# Patient Record
Sex: Male | Born: 1985 | Race: Black or African American | Hispanic: No | Marital: Married | Smoking: Current every day smoker
Health system: Southern US, Community
[De-identification: ages and names within clinical notes are randomized; demographics above are authoritative.]

## PROBLEM LIST (undated history)

## (undated) HISTORY — PX: SKIN GRAFT: SHX250

---
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
## 2007-04-15 ENCOUNTER — Emergency Department: Payer: Self-pay | Admitting: Unknown Physician Specialty

## 2007-10-02 ENCOUNTER — Emergency Department: Payer: Self-pay | Admitting: Emergency Medicine

## 2009-03-13 ENCOUNTER — Emergency Department: Payer: Self-pay | Admitting: Emergency Medicine

## 2009-03-14 ENCOUNTER — Emergency Department: Payer: Self-pay | Admitting: Emergency Medicine

## 2010-07-03 ENCOUNTER — Emergency Department: Payer: Self-pay | Admitting: Emergency Medicine

## 2011-01-17 ENCOUNTER — Ambulatory Visit: Payer: Self-pay | Admitting: Family Medicine

## 2011-01-23 ENCOUNTER — Emergency Department: Payer: Self-pay | Admitting: Emergency Medicine

## 2012-04-24 ENCOUNTER — Emergency Department: Payer: Self-pay | Admitting: Emergency Medicine

## 2014-02-28 ENCOUNTER — Emergency Department: Payer: Self-pay | Admitting: Emergency Medicine

## 2014-08-28 ENCOUNTER — Emergency Department: Payer: Self-pay | Admitting: Emergency Medicine

## 2014-08-28 LAB — URINALYSIS, COMPLETE
BACTERIA: NONE SEEN
BLOOD: NEGATIVE
Bilirubin,UR: NEGATIVE
GLUCOSE, UR: NEGATIVE mg/dL (ref 0–75)
Leukocyte Esterase: NEGATIVE
Nitrite: NEGATIVE
Ph: 5 (ref 4.5–8.0)
Protein: NEGATIVE
Specific Gravity: 1.03 (ref 1.003–1.030)
WBC UR: NONE SEEN /HPF (ref 0–5)

## 2014-11-27 ENCOUNTER — Emergency Department: Payer: Self-pay | Admitting: Emergency Medicine

## 2014-12-08 ENCOUNTER — Emergency Department: Payer: Self-pay | Admitting: Emergency Medicine

## 2015-02-19 ENCOUNTER — Emergency Department: Admit: 2015-02-19 | Disposition: A | Discharge status: Home / Self Care | Payer: Self-pay | Admitting: Internal Medicine

## 2015-06-08 ENCOUNTER — Encounter (HOSPITAL_COMMUNITY): Payer: Self-pay | Admitting: *Deleted

## 2015-06-08 ENCOUNTER — Emergency Department (HOSPITAL_COMMUNITY)
Admission: EM | Admit: 2015-06-08 | Discharge: 2015-06-08 | Disposition: A | Discharge status: Home / Self Care | Payer: Medicaid Other | Attending: Emergency Medicine | Admitting: Emergency Medicine

## 2015-06-08 DIAGNOSIS — Y929 Unspecified place or not applicable: Secondary | ICD-10-CM | POA: Insufficient documentation

## 2015-06-08 DIAGNOSIS — S39012A Strain of muscle, fascia and tendon of lower back, initial encounter: Secondary | ICD-10-CM | POA: Insufficient documentation

## 2015-06-08 DIAGNOSIS — Z720 Tobacco use: Secondary | ICD-10-CM | POA: Diagnosis not present

## 2015-06-08 DIAGNOSIS — X58XXXA Exposure to other specified factors, initial encounter: Secondary | ICD-10-CM | POA: Insufficient documentation

## 2015-06-08 DIAGNOSIS — Y990 Civilian activity done for income or pay: Secondary | ICD-10-CM | POA: Insufficient documentation

## 2015-06-08 DIAGNOSIS — Z880 Allergy status to penicillin: Secondary | ICD-10-CM | POA: Diagnosis not present

## 2015-06-08 DIAGNOSIS — S3992XA Unspecified injury of lower back, initial encounter: Secondary | ICD-10-CM | POA: Diagnosis present

## 2015-06-08 DIAGNOSIS — Y939 Activity, unspecified: Secondary | ICD-10-CM | POA: Insufficient documentation

## 2015-06-08 DIAGNOSIS — T148XXA Other injury of unspecified body region, initial encounter: Secondary | ICD-10-CM

## 2015-06-08 LAB — CK: CK TOTAL: 286 U/L (ref 49–397)

## 2015-06-08 MED ORDER — NAPROXEN 500 MG PO TABS: 500.0000 mg | ORAL_TABLET | Freq: Two times a day (BID) | ORAL | Status: DC

## 2015-06-08 MED ORDER — METHOCARBAMOL 500 MG PO TABS: 500.0000 mg | ORAL_TABLET | Freq: Two times a day (BID) | ORAL | Status: DC | PRN

## 2015-06-08 MED ORDER — METHOCARBAMOL 500 MG PO TABS
500.0000 mg | ORAL_TABLET | Freq: Once | ORAL | Status: AC
  Administered 2015-06-08: 500 mg via ORAL
  Filled 2015-06-08: qty 1

## 2015-06-08 MED ORDER — KETOROLAC TROMETHAMINE 60 MG/2ML IM SOLN
60.0000 mg | Freq: Once | INTRAMUSCULAR | Status: AC
  Administered 2015-06-08: 60 mg via INTRAMUSCULAR
  Filled 2015-06-08: qty 2

## 2015-06-08 NOTE — ED Notes (Signed)
Pt c/o lower back pain starting this morning. 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 Has not tried anything over the counter.

## 2015-06-08 NOTE — ED Notes (Signed)
Contacted lab for delay on CK; test still running

## 2015-06-08 NOTE — Discharge Instructions (Signed)

## 2015-06-08 NOTE — ED Provider Notes (Signed)
CSN: [id number]     Arrival date & time 06/08/15  1952 History  This chart was scribed for Eber Hong, MD by Budd Palmer, ED Scribe. This patient was seen in room TR08C/TR08C and the patient's care was started at 8:33 PM.    Chief Complaint  Patient presents with  . Back Pain   The history is provided by the patient. No language interpreter was used.   HPI Comments: Jeffery Caldwell is a 29 y.o. male who presents to the Emergency Department complaining of constant, aching middle and lower back pain onset when he woke up this morning. He states he had difficulty getting up and notes exacerbation of the pain with movement. He notes alleviation by sitting still. 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 Pt has had a childhood back injury, but no problems since. He denies a PMHx of back pain. Pt denies numbness, weakness, difficulty urinating, fever, and chills.  History reviewed. No pertinent past medical history. Past Surgical History  Procedure Laterality Date  . Skin graft     No family history on file. History  Substance Use Topics  . Smoking status: Current Every Day Smoker -- 1.00 packs/day    Types: Cigarettes  . Smokeless tobacco: Not on file  . Alcohol Use: No    Review of Systems  Constitutional: Negative for fever and chills.  Genitourinary: Negative for difficulty urinating.  Musculoskeletal: Positive for back pain.  Neurological: Negative for weakness and numbness.    Allergies  Penicillins  Home Medications   Prior to Admission medications   Medication Sig Start Date End Date Taking? Authorizing Provider  methocarbamol (ROBAXIN) 500 MG tablet Take 1 tablet (500 mg total) by mouth 2 (two) times daily as needed for muscle spasms. 06/08/15   Eber Hong, MD  naproxen (NAPROSYN) 500 MG tablet Take 1 tablet (500 mg total) by mouth 2 (two) times daily with a meal. 06/08/15   Eber Hong, MD   BP 135/75 mmHg  Pulse 76  Temp(Src) 98 F (36.7 C) (Oral)   Resp 18  Ht 5\' 11"  (1.803 m)  Wt 240 lb (108.863 kg)  BMI 33.49 kg/m2  SpO2 98% Physical Exam  Constitutional: He appears well-developed and well-nourished.  HENT:  Head: Normocephalic and atraumatic.  Eyes: Conjunctivae are normal. Right eye exhibits no discharge. Left eye exhibits no discharge.  Pulmonary/Chest: Effort normal. No respiratory distress.  Musculoskeletal: He exhibits tenderness.  TTP over the back muscles from his upper lumbar to mid-thoracic spine. No bony tenderness.  Neurological: He is alert. Coordination normal.  Steady gait. Normal strength at hips an knees bil.  Skin: Skin is warm and dry. No rash noted. He is not diaphoretic. No erythema.  Psychiatric: He has a normal mood and affect.  Nursing note and vitals reviewed.   ED Course  Procedures  DIAGNOSTIC STUDIES: Oxygen Saturation is 98% on RA, normal by my interpretation.    COORDINATION OF CARE: 8:38 PM - Discussed possible muscle strain and breakdown. Discussed plans to order diagnostic studies, muscle relaxant, and pain medication. Pt advised of plan for treatment and pt agrees.  Labs Review Labs Reviewed  CK    Imaging Review No results found.  The pt has refused to stay to get his CK resulted - his VS are normal - he is better after meds.  Recommeded that he f/u with his PCP for recheck and review labs if no better - encouraged hydration for next several days.  MDM  Final diagnoses:  Muscle strain   Meds given in ED:  Medications  methocarbamol (ROBAXIN) tablet 500 mg (500 mg Oral Given 06/08/15 2046)  ketorolac (TORADOL) injection 60 mg (60 mg Intramuscular Given 06/08/15 2045)    New Prescriptions   METHOCARBAMOL (ROBAXIN) 500 MG TABLET    Take 1 tablet (500 mg total) by mouth 2 (two) times daily as needed for muscle spasms.   NAPROXEN (NAPROSYN) 500 MG TABLET    Take 1 tablet (500 mg total) by mouth 2 (two) times daily with a meal.      I personally performed the services  described in this documentation, which was scribed in my presence. The recorded information has been reviewed and considered.     Eber Hong, MD 06/08/15 2211

## 2015-11-26 ENCOUNTER — Emergency Department
Admission: EM | Admit: 2015-11-26 | Discharge: 2015-11-27 | Disposition: A | Discharge status: Home / Self Care | Payer: Medicaid Other | Attending: Student | Admitting: Student

## 2015-11-26 ENCOUNTER — Emergency Department: Payer: Medicaid Other

## 2015-11-26 DIAGNOSIS — Y9289 Other specified places as the place of occurrence of the external cause: Secondary | ICD-10-CM | POA: Diagnosis not present

## 2015-11-26 DIAGNOSIS — F17210 Nicotine dependence, cigarettes, uncomplicated: Secondary | ICD-10-CM | POA: Diagnosis not present

## 2015-11-26 DIAGNOSIS — Z880 Allergy status to penicillin: Secondary | ICD-10-CM | POA: Diagnosis not present

## 2015-11-26 DIAGNOSIS — W2209XA Striking against other stationary object, initial encounter: Secondary | ICD-10-CM | POA: Insufficient documentation

## 2015-11-26 DIAGNOSIS — S60221A Contusion of right hand, initial encounter: Secondary | ICD-10-CM | POA: Diagnosis not present

## 2015-11-26 DIAGNOSIS — Z792 Long term (current) use of antibiotics: Secondary | ICD-10-CM | POA: Diagnosis not present

## 2015-11-26 DIAGNOSIS — Y998 Other external cause status: Secondary | ICD-10-CM | POA: Diagnosis not present

## 2015-11-26 DIAGNOSIS — Y9389 Activity, other specified: Secondary | ICD-10-CM | POA: Diagnosis not present

## 2015-11-26 DIAGNOSIS — S6991XA Unspecified injury of right wrist, hand and finger(s), initial encounter: Secondary | ICD-10-CM | POA: Diagnosis present

## 2015-11-26 MED ORDER — IBUPROFEN 800 MG PO TABS: 800.0000 mg | ORAL_TABLET | Freq: Three times a day (TID) | ORAL | Status: DC | PRN

## 2015-11-26 MED ORDER — OXYCODONE-ACETAMINOPHEN 5-325 MG PO TABS
1.0000 | ORAL_TABLET | Freq: Once | ORAL | Status: AC
  Administered 2015-11-26: 1 via ORAL
  Filled 2015-11-26: qty 1

## 2015-11-26 MED ORDER — TRAMADOL HCL 50 MG PO TABS: 50.0000 mg | ORAL_TABLET | Freq: Four times a day (QID) | ORAL | Status: DC | PRN

## 2015-11-26 NOTE — Discharge Instructions (Signed)
Wear splint as needed Hand Contusion  A hand contusion is a deep bruise to the hand. Contusions happen when an injury causes bleeding under the skin. Signs of bruising include pain, puffiness (swelling), and discolored skin. The contusion may turn blue, purple, or yellow. HOME CARE  Put ice on the injured area.  Put ice in a plastic bag.  Place a towel between your skin and the bag.  Leave the ice on for 15-20 minutes, 03-04 times a day.  Only take medicines as told by your doctor.  Use an elastic wrap only as told. You may remove the wrap for sleeping, showering, and bathing. Take the wrap off if you lose feeling (have numbness) in your fingers, or they turn blue or cold. Put the wrap on more loosely.  Keep the hand raised (elevated) with pillows.  Avoid using your hand too much if it is painful. GET HELP RIGHT AWAY IF:   You have more redness, puffiness, or pain in your hand.  Your puffiness or pain does not get better with medicine.  You lose feeling in your hand, or you cannot move your fingers.  Your hand turns cold or blue.  You have pain when you move your fingers.  Your hand feels warm.  Your contusion does not get better in 2 days. MAKE SURE YOU:   Understand these instructions.  Will watch this condition.  Will get help right away if you are not doing well or you get worse.   This information is not intended to replace advice given to you by your health care provider. Make sure you discuss any questions you have with your health care provider.   Document Released: 04/11/2008 Document Revised: 11/14/2014 Document Reviewed: 04/16/2012 Elsevier Interactive Patient Education [employer]
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
## 2016-04-27 ENCOUNTER — Emergency Department
Admission: EM | Admit: 2016-04-27 | Discharge: 2016-04-27 | Disposition: A | Discharge status: Home / Self Care | Payer: Medicaid Other | Attending: Student | Admitting: Student

## 2016-04-27 ENCOUNTER — Encounter: Payer: Self-pay | Admitting: Emergency Medicine

## 2016-04-27 DIAGNOSIS — F17210 Nicotine dependence, cigarettes, uncomplicated: Secondary | ICD-10-CM | POA: Insufficient documentation

## 2016-04-27 DIAGNOSIS — T63464A Toxic effect of venom of wasps, undetermined, initial encounter: Secondary | ICD-10-CM | POA: Insufficient documentation

## 2016-04-27 MED ORDER — IBUPROFEN 600 MG PO TABS: 600.0000 mg | ORAL_TABLET | Freq: Four times a day (QID) | ORAL | Status: DC | PRN

## 2016-04-27 MED ORDER — IBUPROFEN 600 MG PO TABS
600.0000 mg | ORAL_TABLET | Freq: Once | ORAL | Status: AC
  Administered 2016-04-27: 600 mg via ORAL
  Filled 2016-04-27: qty 1

## 2016-04-27 MED ORDER — EPINEPHRINE 0.3 MG/0.3ML IJ SOAJ: 0.3000 mg | Freq: Once | INTRAMUSCULAR | Status: DC

## 2016-04-27 MED ORDER — DIPHENHYDRAMINE HCL 25 MG PO CAPS
25.0000 mg | ORAL_CAPSULE | Freq: Once | ORAL | Status: AC
  Administered 2016-04-27: 25 mg via ORAL
  Filled 2016-04-27: qty 1

## 2016-04-27 MED ORDER — DIPHENHYDRAMINE HCL 25 MG PO CAPS: 25.0000 mg | ORAL_CAPSULE | Freq: Every evening | ORAL | Status: AC | PRN

## 2016-04-27 NOTE — ED Notes (Addendum)
Patient ambulatory to triage with steady gait, without difficulty or distress noted; pt reports yellow jacket sting to right ankle at 11pm; c/o pain/itching to site; denies any other accomp symptoms at present

## 2016-04-27 NOTE — ED Provider Notes (Signed)
[facility] Emergency Department Provider Note   ____________________________________________  Time seen: Approximately 2:34 AM  I have reviewed the triage vital signs and the nursing notes.   HISTORY  Chief Complaint No chief complaint on file.    HPI Jeffery Caldwell is a 30 y.o. male with history of local reactions to yellow jacket stings, no history of anaphylaxis who presents for evaluation of swelling, itching, and pain to the medial aspect of the right ankle secondary to a yellow jacket sting which occurred last night at 11 PM, gradual onset, constant, moderate pain, worse when he moves his ankle. No shortness of breath, no lip or tongue swelling, no vomiting or rash. No chest pain or difficulty breathing.   History reviewed. No pertinent past medical history.  There are no active problems to display for this patient.   Past Surgical History  Procedure Laterality Date  . Skin graft      Current Outpatient Rx  Name  Route  Sig  Dispense  Refill  . ibuprofen (ADVIL,MOTRIN) 800 MG tablet   Oral   Take 1 tablet (800 mg total) by mouth every 8 (eight) hours as needed.   30 tablet   0   . methocarbamol (ROBAXIN) 500 MG tablet   Oral   Take 1 tablet (500 mg total) by mouth 2 (two) times daily as needed for muscle spasms.   20 tablet   0   . naproxen (NAPROSYN) 500 MG tablet   Oral   Take 1 tablet (500 mg total) by mouth 2 (two) times daily with a meal.   30 tablet   0   . traMADol (ULTRAM) 50 MG tablet   Oral   Take 1 tablet (50 mg total) by mouth every 6 (six) hours as needed for moderate pain.   12 tablet   0     Allergies Penicillins  No family history on file.  Social History Social History  Substance Use Topics  . Smoking status: Current Every Day Smoker -- 1.00 packs/day    Types: Cigarettes  . Smokeless tobacco: None  . Alcohol Use: No    Review of Systems Constitutional: No fever/chills Eyes: No visual  changes. ENT: No sore throat. Cardiovascular: Denies chest pain. Respiratory: Denies shortness of breath. Gastrointestinal: No abdominal pain.  No nausea, no vomiting.  No diarrhea.  No constipation. Genitourinary: Negative for dysuria. Musculoskeletal: Negative for back pain. Skin: Negative for rash. Neurological: Negative for headaches, focal weakness or numbness.  10-point ROS otherwise negative.  ____________________________________________   PHYSICAL EXAM:  VITAL SIGNS: ED Triage Vitals  Enc Vitals Group     BP 04/27/16 0023 132/75 mmHg     Pulse Rate 04/27/16 0023 95     Resp 04/27/16 0023 20     Temp 04/27/16 0023 97.7 F (36.5 C)     Temp Source 04/27/16 0023 Oral     SpO2 04/27/16 0023 97 %     Weight 04/27/16 0023 250 lb (113.399 kg)     Height 04/27/16 0023  (1.778 m)     Head Cir --      Peak Flow --      Pain Score 04/27/16 0022 7     Pain Loc --      Pain Edu? --      Excl. in GC? --     Constitutional: Alert and oriented. Well appearing and in no acute distress. Eyes: Conjunctivae are normal. PERRL. EOMI. Head: Atraumatic. Nose: No  congestion/rhinnorhea. Mouth/Throat: Mucous membranes are moist.  Oropharynx non-erythematous. No swelling of the posterior oropharynx. Neck: No stridor.  Cardiovascular: Normal rate, regular rhythm. Grossly normal heart sounds.  Good peripheral circulation. Respiratory: Normal respiratory effort.  No retractions. Lungs CTAB. Gastrointestinal: Soft and nontender. No distention.  No CVA tenderness. Genitourinary: deferred Musculoskeletal: Mild to moderate swelling in the medial right foot/ankle and associated with the right medial malleolus, no calf involvement. 2+ DP pulse bilaterally, wiggles the toes. Neurologic:  Normal speech and language. No gross focal neurologic deficits are appreciated. No gait instability. Skin:  Skin is warm, dry and intact. No rash noted. Psychiatric: Mood and affect are normal. Speech and  behavior are normal.  ____________________________________________   LABS (all labs ordered are listed, but only abnormal results are displayed)  Labs Reviewed - No data to display ____________________________________________  EKG  none ____________________________________________  RADIOLOGY  none ____________________________________________   PROCEDURES  Procedure(s) performed: None  Critical Care performed: No  ____________________________________________   INITIAL IMPRESSION / ASSESSMENT AND PLAN / ED COURSE  Pertinent labs & imaging results that were available during my care of the patient were reviewed by me and considered in my medical decision making (see chart for details).  Jeffery Caldwell is a 30 y.o. male with history of local reactions to yellow jacket stings, no history of anaphylaxis who presents for evaluation of swelling and pain to the medial aspect of the right ankle secondary to a yellow jacket sting which occurred last night at 11 PM. On exam, he is very well-appearing and in no acute distress, his vital signs are stable, he is afebrile. He does have mild to moderate swelling associated with the medial aspect of the right foot/ankle. His exam is consistent with a local inflammatory/allergic reaction secondary to yellow jacket sting. We'll DC with Benadryl, Motrin and Tylenol for pain. We also discussed indications/use of EpiPen as the patient reports that previously was prescribed an epipen but he does not have one currently because he has "moved aaround a lot". I will give him a prescription for EpiPen though he knows he would only administer it in the event of allergic reaction involving the face/tongue/ any shortness of breath, etc. We discussed return precautions and he is comfortable with the discharge plan. DC home. ____________________________________________   FINAL CLINICAL IMPRESSION(S) / ED DIAGNOSES  Final diagnoses:  Yellow jacket sting,  undetermined intent, initial encounter      NEW MEDICATIONS STARTED DURING THIS VISIT:  New Prescriptions   No medications on file     Note:  This document was prepared using Dragon voice recognition software and may include unintentional dictation errors.    Gayla DossEryka A Dublin Grayer, MD 04/27/16 [phone]

## 2016-09-09 ENCOUNTER — Emergency Department
Admission: EM | Admit: 2016-09-09 | Discharge: 2016-09-09 | Disposition: A | Discharge status: Home / Self Care | Payer: Medicaid Other | Attending: Emergency Medicine | Admitting: Emergency Medicine

## 2016-09-09 ENCOUNTER — Encounter: Payer: Self-pay | Admitting: Emergency Medicine

## 2016-09-09 DIAGNOSIS — K029 Dental caries, unspecified: Secondary | ICD-10-CM

## 2016-09-09 DIAGNOSIS — Z791 Long term (current) use of non-steroidal anti-inflammatories (NSAID): Secondary | ICD-10-CM | POA: Diagnosis not present

## 2016-09-09 DIAGNOSIS — F17210 Nicotine dependence, cigarettes, uncomplicated: Secondary | ICD-10-CM | POA: Insufficient documentation

## 2016-09-09 DIAGNOSIS — K0889 Other specified disorders of teeth and supporting structures: Secondary | ICD-10-CM | POA: Diagnosis present

## 2016-09-09 MED ORDER — CLINDAMYCIN HCL 150 MG PO CAPS
300.0000 mg | ORAL_CAPSULE | Freq: Once | ORAL | Status: AC
  Administered 2016-09-09: 300 mg via ORAL
  Filled 2016-09-09: qty 2

## 2016-09-09 MED ORDER — CLINDAMYCIN HCL 300 MG PO CAPS: 300.0000 mg | ORAL_CAPSULE | Freq: Three times a day (TID) | ORAL | 0 refills | Status: AC

## 2016-09-09 MED ORDER — TRAMADOL HCL 50 MG PO TABS: 50.0000 mg | ORAL_TABLET | Freq: Once | ORAL | Status: DC

## 2016-09-09 MED ORDER — HYDROCODONE-ACETAMINOPHEN 5-325 MG PO TABS
1.0000 | ORAL_TABLET | Freq: Once | ORAL | Status: AC
  Administered 2016-09-09: 1 via ORAL
  Filled 2016-09-09: qty 1

## 2016-09-09 MED ORDER — TRAMADOL HCL 50 MG PO TABS: 50.0000 mg | ORAL_TABLET | Freq: Three times a day (TID) | ORAL | 0 refills | Status: DC | PRN

## 2016-09-09 NOTE — Discharge Instructions (Signed)
Your are being treated for potential dental infection and dental pain due to chronically broken teeth. You should follow-up with one of the local dental providers for dental extraction. Take the prescription antibiotic and pain medicine as directed.   OPTIONS FOR DENTAL FOLLOW UP CARE  [place] Department of Health and Human Services - Local Safety Net Dental Clinics [email or web address]://www.ncdhhs.gov/dph/oralhealth/services/safetynetclinics.htm   [facility] [phone])  [facility] [phone])  [facility] [phone]  [facility] [phone])  [facility] [phone]) This clinic caters to the indigent population and is on a lottery system. Location: [employer] [address] [address], [address], [place] [facility] Hours: Wednesdays from 6pm - 9pm, patients seen by a lottery system. For dates, call or go to [email or web address] Services: Cleanings, fillings and simple extractions. Payment Options: DENTAL WORK IS FREE OF CHARGE. Bring proof of income or support. Best way to get seen: Arrive at 5:15 pm - this is a lottery, NOT first come/first serve, so arriving earlier will not increase your chances of being seen.     [facility] [phone] Select option 1 for emergencies   Location: [facility], [address], [place] [facility] Hours: No walk-ins accepted - call the day before to schedule an appointment. Check in times are 9:30 am and 1:30 pm. Services: Simple extractions, temporary fillings, pulpectomy/pulp debridement, uncomplicated abscess drainage. Payment Options: PAYMENT IS DUE AT THE TIME OF SERVICE.  Fee is usually $100-200, additional surgical procedures (e.g. abscess drainage) may be extra. Cash, checks, Visa/MasterCard accepted.  Can file Medicaid if patient is covered for dental - patient should call case  worker to check. No discount for [facility] patients. Best way to get seen: MUST call the day before and get onto the schedule. Can usually be seen the next 1-2 days. No walk-ins accepted.     [facility] [phone]   Location: [facility], [address], [place] [facility] Hours: M, W, Th, F 8am or 1:30pm, Tues 9a or 1:30 - first come/first served. Services: Simple extractions, temporary fillings, uncomplicated abscess drainage.  You do not need to be an [place] resident. Payment Options: PAYMENT IS DUE AT THE TIME OF SERVICE. Dental insurance, otherwise sliding scale - bring proof of income or support. Depending on income and treatment needed, cost is usually $50-200. Best way to get seen: Arrive early as it is first come/first served.     [facility] [phone]   Location: [address] Clinic Hours: Mon-Thu 8a-5p Services: Most basic dental services including extractions and fillings. Payment Options: PAYMENT IS DUE AT THE TIME OF SERVICE. Sliding scale, up to 50% off - bring proof if income or support. Medicaid with dental option accepted. Best way to get seen: Call to schedule an appointment, can usually be seen within 2 weeks OR they will try to see walk-ins - show up at 8a or 2p (you may have to wait).     [facility] [phone] [place] RESIDENTS ONLY   Location: [facility], [address], [place], [place] [address] Clinic Hours: By appointment only. Monday - Thursday 8am-5pm, Friday 8am-12pm Services: Cleanings, fillings, extractions. Payment Options: PAYMENT IS DUE AT THE TIME OF SERVICE. Cash, Visa or MasterCard. Sliding scale - $30 minimum per service. Best way to get seen: Come in to office, complete packet and make an appointment - need  proof of income or support monies for each household member and proof of  [facility] [place] [facility] [place] residence. Usually takes about a month to get in.     [facility] [phone]   Location: [address] [facility] Hours: Walk-in Urgent Care Dental Services are offered Monday-Friday mornings only. The numbers of emergencies accepted daily is limited to the number of providers available. Maximum 15 - Mondays, Wednesdays & Thursdays Maximum 10 - Tuesdays & Fridays Services: You do not need to be a [place] resident to be seen for a dental emergency. Emergencies are defined as pain, swelling, abnormal bleeding, or dental trauma. Walkins will receive x-rays if needed. NOTE: Dental cleaning is not an emergency. Payment Options: PAYMENT IS DUE AT THE TIME OF SERVICE. Minimum co-pay is $40.00 for uninsured patients. Minimum co-pay is $3.00 for Medicaid with dental coverage. Dental Insurance is accepted and must be presented at time of visit. Medicare does not cover dental. Forms of payment: Cash, credit card, checks. Best way to get seen: If not previously registered with the clinic, walk-in dental registration begins at 7:15 am and is on a first come/first serve basis. If previously registered with the clinic, call to make an appointment.     [facility] [phone] [place] RESIDENTS ONLY   Location: [address], [place], [place] Clinic Hours: Mon-Thu 10a-2p Services: Extractions only! Payment Options: FREE (donations accepted) - bring proof of income or support Best way to get seen: Call and schedule an appointment OR come at 8am on the 1st Monday of every month (except for holidays) when it is first come/first served.     [facility] [phone]   Location: [address], [place] [facility] Hours: Friday mornings Services, Payment Options, Best way to get seen: Call for info

## 2016-09-09 NOTE — ED Notes (Signed)
Pt alert and oriented X4, active, cooperative, pt in NAD. RR even and unlabored, color WNL.  Pt informed to return if any life threatening symptoms occur.   

## 2016-09-09 NOTE — ED Triage Notes (Signed)
Pt comes into the ED via POV c/o dental pain on the right and left lower side of his mouth.  Patient states this has been going on for 2 months but he didn't get his medicaid back until 2 days ago.  Patient in NAD at this time with even and unlabored respirations.

## 2016-09-10 NOTE — ED Provider Notes (Signed)
[facility] Emergency Department Provider Note ____________________________________________  Time seen: 2237  I have reviewed the triage vital signs and the nursing notes.  HISTORY  Chief Complaint  Dental Pain  HPI Jeffery Caldwell is a 30 y.o. male presents to the ED for complaints of dental pain to the right and left lower jaw. Patient describes ongoing pain for the last 2 months. He does admit to chronically broken teeth and cavities to the molars. He denies any interim fevers, chills, sweats. He also denies any gum swelling or spontaneous drainage. He has not been provider recently.  History reviewed. No pertinent past medical history.  There are no active problems to display for this patient.  Past Surgical History:  Procedure Laterality Date  . SKIN GRAFT      Prior to Admission medications   Medication Sig Start Date End Date Taking? Authorizing Provider  clindamycin (CLEOCIN) 300 MG capsule Take 1 capsule (300 mg total) by mouth 3 (three) times daily. 09/09/16 09/19/16  Toma Erichsen V Bacon Marquel Pottenger, PA-C  diphenhydrAMINE (BENADRYL) 25 mg capsule Take 1 capsule (25 mg total) by mouth at bedtime as needed for itching. 04/27/16 04/27/17  Gayla DossEryka A Gayle, MD  EPINEPHrine (ADRENACLICK) 0.3 mg/0.3 mL IJ SOAJ injection Inject 0.3 mLs (0.3 mg total) into the muscle once. Inject into the muscle once if you develop severe allergic reaction including but not limited to tongue/mouth/throat/face swelling, cough, vomiting, or shortness of breath. Then call 911. 04/27/16   Gayla DossEryka A Gayle, MD  ibuprofen (ADVIL,MOTRIN) 600 MG tablet Take 1 tablet (600 mg total) by mouth every 6 (six) hours as needed for moderate pain. 04/27/16   Gayla DossEryka A Gayle, MD  naproxen (NAPROSYN) 500 MG tablet Take 1 tablet (500 mg total) by mouth 2 (two) times daily with a meal. 06/08/15   Eber HongBrian Miller, MD  traMADol (ULTRAM) 50 MG tablet Take 1 tablet (50 mg total) by mouth 3 (three) times daily as needed. 09/09/16    Chrisotpher Rivero V Bacon Lenyx Boody, PA-C   Allergies Penicillins  No family history on file.  Social History Social History  Substance Use Topics  . Smoking status: Current Every Day Smoker    Packs/day: 1.00    Types: Cigarettes  . Smokeless tobacco: Never Used  . Alcohol use No    Review of Systems  Constitutional: Negative for fever. Eyes: Negative for visual changes. ENT: Negative for sore throat. Lower jaw dental pain as above.  Cardiovascular: Negative for chest pain. Respiratory: Negative for shortness of breath. Gastrointestinal: Negative for abdominal pain, vomiting and diarrhea. Skin: Negative for rash.  Neurological: Negative for headaches, focal weakness or numbness. ____________________________________________  PHYSICAL EXAM:  VITAL SIGNS: ED Triage Vitals  Enc Vitals Group     BP 09/09/16 2144 (!) 161/88     Pulse Rate 09/09/16 2144 (!) 111     Resp 09/09/16 2144 18     Temp 09/09/16 2144 98.3 F (36.8 C)     Temp Source 09/09/16 2144 Oral     SpO2 09/09/16 2144 97 %     Weight 09/09/16 2144 240 lb (108.9 kg)     Height 09/09/16 2144 5\' 10"  (1.778 m)     Head Circumference --      Peak Flow --      Pain Score 09/09/16 2145 10     Pain Loc --      Pain Edu? --      Excl. in [place]? --    Constitutional: Alert and oriented.  Well appearing and in no distress. Head: Normocephalic and atraumatic. Eyes: Conjunctivae are normal. PERRL. Normal extraocular movements Ears: Canals clear. TMs intact bilaterally. Nose: No congestion/rhinorrhea/epistaxis. Mouth/Throat: Mucous membranes are moist. Uvula is midline and tonsils are flat. Patient without any use local gum swelling, edema, fluctuance, or pointing abscess. There is no involvement of the floor the mouth. He is noted to have a chronically broken upper second molar as well as a crack to his lower right second molar. He localizes pain to his lower left first molar. The remaining molars have been previously  extracted. Neck: Supple. No thyromegaly. Hematological/Lymphatic/Immunological: No cervical lymphadenopathy. Cardiovascular: Normal rate, regular rhythm. Normal distal pulses. Respiratory: Normal respiratory effort. No wheezes/rales/rhonchi. Neurologic:  Normal gait without ataxia. Normal speech and language. No gross focal neurologic deficits are appreciated. Skin:  Skin is warm, dry and intact. No rash noted. Psychiatric: Mood and affect are normal. Patient exhibits appropriate insight and judgment. ____________________________________________  PROCEDURES  Norco 5-325 mg PO Clindamycin 300 mg PO ____________________________________________  INITIAL IMPRESSION / ASSESSMENT AND PLAN / ED COURSE  Patient with acute dental pain secondary to chronic dental caries and chronically fractured teeth. He is discharged with empiric treatment for dental abscess with clindamycin as well as pain relief with Ultram. He will follow-up with 1 of the dental providers on the list given.  Clinical Course   ____________________________________________  FINAL CLINICAL IMPRESSION(S) / ED DIAGNOSES  Final diagnoses:  Pain due to dental caries      Lissa HoardJenise V Bacon Brennan Karam, PA-C 09/10/16 0020    Myrna Blazeravid Matthew Schaevitz, MD 09/10/16 2227

## 2016-12-08 ENCOUNTER — Other Ambulatory Visit: Payer: Self-pay | Admitting: Physician Assistant

## 2016-12-08 DIAGNOSIS — R748 Abnormal levels of other serum enzymes: Secondary | ICD-10-CM

## 2016-12-15 ENCOUNTER — Ambulatory Visit
Admission: RE | Admit: 2016-12-15 | Discharge: 2016-12-15 | Disposition: A | Discharge status: Home / Self Care | Payer: Medicaid Other | Source: Ambulatory Visit | Attending: Physician Assistant | Admitting: Physician Assistant

## 2016-12-15 DIAGNOSIS — R748 Abnormal levels of other serum enzymes: Secondary | ICD-10-CM | POA: Diagnosis present

## 2017-01-11 ENCOUNTER — Ambulatory Visit: Payer: Medicaid Other

## 2019-09-30 ENCOUNTER — Encounter: Payer: Self-pay | Admitting: Emergency Medicine

## 2019-09-30 ENCOUNTER — Emergency Department
Admission: EM | Admit: 2019-09-30 | Discharge: 2019-09-30 | Disposition: A | Discharge status: Home / Self Care | Payer: Medicaid Other | Attending: Emergency Medicine | Admitting: Emergency Medicine

## 2019-09-30 ENCOUNTER — Emergency Department: Payer: Medicaid Other

## 2019-09-30 ENCOUNTER — Other Ambulatory Visit: Payer: Self-pay

## 2019-09-30 DIAGNOSIS — Y9361 Activity, american tackle football: Secondary | ICD-10-CM | POA: Insufficient documentation

## 2019-09-30 DIAGNOSIS — X509XXA Other and unspecified overexertion or strenuous movements or postures, initial encounter: Secondary | ICD-10-CM | POA: Insufficient documentation

## 2019-09-30 DIAGNOSIS — S63501A Unspecified sprain of right wrist, initial encounter: Secondary | ICD-10-CM | POA: Insufficient documentation

## 2019-09-30 DIAGNOSIS — Y92321 Football field as the place of occurrence of the external cause: Secondary | ICD-10-CM | POA: Insufficient documentation

## 2019-09-30 DIAGNOSIS — Y998 Other external cause status: Secondary | ICD-10-CM | POA: Insufficient documentation

## 2019-09-30 DIAGNOSIS — F17210 Nicotine dependence, cigarettes, uncomplicated: Secondary | ICD-10-CM | POA: Insufficient documentation

## 2019-09-30 MED ORDER — NAPROXEN 500 MG PO TABS
500.0000 mg | ORAL_TABLET | Freq: Once | ORAL | Status: AC
  Administered 2019-09-30: 500 mg via ORAL
  Filled 2019-09-30: qty 1

## 2019-09-30 MED ORDER — TRAMADOL HCL 50 MG PO TABS
50.0000 mg | ORAL_TABLET | Freq: Once | ORAL | Status: AC
  Administered 2019-09-30: 50 mg via ORAL
  Filled 2019-09-30: qty 1

## 2019-09-30 MED ORDER — TRAMADOL HCL 50 MG PO TABS: 50.0000 mg | ORAL_TABLET | Freq: Four times a day (QID) | ORAL | 0 refills | Status: AC | PRN

## 2019-09-30 MED ORDER — IBUPROFEN 600 MG PO TABS: 600.0000 mg | ORAL_TABLET | Freq: Three times a day (TID) | ORAL | 0 refills | Status: AC | PRN

## 2019-09-30 NOTE — ED Provider Notes (Signed)
[facility] Emergency Department Provider Note   ____________________________________________   First MD Initiated Contact with Patient 09/30/19 1257     (approximate)  I have reviewed the triage vital signs and the nursing notes.   HISTORY  Chief Complaint Wrist Pain    HPI Jeffery Caldwell is a 33 y.o. male patient presents with right hand and wrist pain secondary to hyperextension incident while playing football yesterday.  Patient the pain has worsened today.  Patient denies loss of sensation.  Patient the pain increases with palpation, flexion, and extension.  Patient rates pain as a 10/10.  Patient described the pain as "achy".  No palliative measure for complaint.  Patient is left-hand dominant.         History reviewed. No pertinent past medical history.  There are no active problems to display for this patient.   Past Surgical History:  Procedure Laterality Date  . SKIN GRAFT      Prior to Admission medications   Medication Sig Start Date End Date Taking? Authorizing Provider  diphenhydrAMINE (BENADRYL) 25 mg capsule Take 1 capsule (25 mg total) by mouth at bedtime as needed for itching. 04/27/16 04/27/17  Joanne Gavel, MD  ibuprofen (ADVIL) 600 MG tablet Take 1 tablet (600 mg total) by mouth every 8 (eight) hours as needed. 09/30/19   Sable Feil, PA-C  traMADol (ULTRAM) 50 MG tablet Take 1 tablet (50 mg total) by mouth every 6 (six) hours as needed for moderate pain. 09/30/19   Sable Feil, PA-C    Allergies Penicillins  No family history on file.  Social History Social History   Tobacco Use  . Smoking status: Current Every Day Smoker    Packs/day: 1.00    Types: Cigarettes  . Smokeless tobacco: Never Used  Substance Use Topics  . Alcohol use: No  . Drug use: No    Review of Systems Constitutional: No fever/chills Eyes: No visual changes. ENT: No sore throat. Cardiovascular: Denies chest pain. Respiratory:  Denies shortness of breath. Gastrointestinal: No abdominal pain.  No nausea, no vomiting.  No diarrhea.  No constipation. Genitourinary: Negative for dysuria. Musculoskeletal: Wrist pain. Skin: Negative for rash. Neurological: Negative for headaches, focal weakness or numbness.   ____________________________________________   PHYSICAL EXAM:  VITAL SIGNS: ED Triage Vitals  Enc Vitals Group     BP      Pulse      Resp      Temp      Temp src      SpO2      Weight      Height      Head Circumference      Peak Flow      Pain Score      Pain Loc      Pain Edu?      Excl. in [place]?    Constitutional: Alert and oriented. Well appearing and in no acute distress. Cardiovascular: Normal rate, regular rhythm. Grossly normal heart sounds.  Good peripheral circulation. Respiratory: Normal respiratory effort.  No retractions. Lungs CTAB. Musculoskeletal: No obvious deformity to the right wrist/hand.  Moderate edema.  Decreased range of motion with flexion extension.  Patient is moderate guarding palpation first metacarpal head.  Neurologic:  Normal speech and language. No gross focal neurologic deficits are appreciated. No gait instability. Skin:  Skin is warm, dry and intact. No rash noted. Psychiatric: Mood and affect are normal. Speech and behavior are normal.  ____________________________________________   LABS (  all labs ordered are listed, but only abnormal results are displayed)  Labs Reviewed - No data to display ____________________________________________  EKG   ____________________________________________  RADIOLOGY  ED MD interpretation:    Official radiology report(s): Dg Wrist Complete Right  Result Date: 09/30/2019 CLINICAL DATA:  Pain and edema secondary to hyperextension incident prior to arrival. EXAM: RIGHT WRIST - COMPLETE 3+ VIEW COMPARISON:  Radiographs of the right wrist 12/08/2014 FINDINGS: There is normal bony alignment. No evidence of acute osseous  or articular abnormality. The joint spaces are maintained. IMPRESSION: No evidence of acute osseous or articular abnormality. Electronically Signed   By: Jackey Loge DO   On: 09/30/2019 13:27    ____________________________________________   PROCEDURES  Procedure(s) performed (including Critical Care):  Procedures   ____________________________________________   INITIAL IMPRESSION / ASSESSMENT AND PLAN / ED COURSE  As part of my medical decision making, I reviewed the following data within the electronic MEDICAL RECORD NUMBER      Patient presents with right wrist pain secondary to hyperextension injury playing football yesterday.  Discussed negative x-ray findings with patient.  Patient feels exam consistent with hyperextension injury of the right wrist.  Patient placed in OCL splint/sling.  Patient given discharge care instruction advised follow-up with PCP for continued care.    Jeffery Caldwell was evaluated in Emergency Department on 09/30/2019 for the symptoms described in the history of present illness. He was evaluated in the context of the global COVID-19 pandemic, which necessitated consideration that the patient might be at risk for infection with the SARS-CoV-2 virus that causes COVID-19. Institutional protocols and algorithms that pertain to the evaluation of patients at risk for COVID-19 are in a state of rapid change based on information released by regulatory bodies including the CDC and federal and state organizations. These policies and algorithms were followed during the patient's care in the ED.       ____________________________________________   FINAL CLINICAL IMPRESSION(S) / ED DIAGNOSES  Final diagnoses:  Sprain of right wrist, initial encounter     ED Discharge Orders         Ordered    traMADol (ULTRAM) 50 MG tablet  Every 6 hours PRN     09/30/19 1345    ibuprofen (ADVIL) 600 MG tablet  Every 8 hours PRN     09/30/19 1345           Note:  This  document was prepared using Dragon voice recognition software and may include unintentional dictation errors.    Joni Reining, PA-C 09/30/19 1347    Chesley Noon, MD 09/30/19 [phone]

## 2019-09-30 NOTE — ED Triage Notes (Signed)
States he bent his right hand back yesterday while playing f/b  Having pain to right wrist area

## 2019-09-30 NOTE — Discharge Instructions (Signed)
Follow discharge care instructions and wear wrist splint/sling for 3 to 5 days.  May remove for personal hygiene.

## 2019-10-04 ENCOUNTER — Emergency Department
Admission: EM | Admit: 2019-10-04 | Discharge: 2019-10-04 | Disposition: A | Discharge status: Home / Self Care | Payer: Medicaid Other | Attending: Student in an Organized Health Care Education/Training Program | Admitting: Student in an Organized Health Care Education/Training Program

## 2019-10-04 ENCOUNTER — Other Ambulatory Visit: Payer: Self-pay

## 2019-10-04 ENCOUNTER — Encounter: Payer: Self-pay | Admitting: Emergency Medicine

## 2019-10-04 DIAGNOSIS — M25531 Pain in right wrist: Secondary | ICD-10-CM

## 2019-10-04 DIAGNOSIS — F17210 Nicotine dependence, cigarettes, uncomplicated: Secondary | ICD-10-CM | POA: Insufficient documentation

## 2019-10-04 NOTE — Discharge Instructions (Signed)
Call make an appointment with Dr. Mack Guise who is the orthopedist on call.  Wear your splint for approximately 2 more days then you may remove the splint as your x-rays did not show a fracture.  Continue taking ibuprofen every 8 hours and tramadol if needed for pain.  Ice and elevation as needed for pain or swelling.

## 2019-10-04 NOTE — ED Notes (Signed)
Patient has splint on arm that was placed when he was here. Reports he needs another work note because the one he was given was only for no lifting at his job X3 days. Reports he was never told to follow up with orthopedics

## 2019-10-04 NOTE — ED Provider Notes (Signed)
[facility] Emergency Department Provider Note  ____________________________________________   First MD Initiated Contact with Patient 10/04/19 1420     (approximate)  I have reviewed the triage vital signs and the nursing notes.   HISTORY  Chief Complaint Work Note   HPI Jeffery Caldwell is a 33 y.o. male presents to the ED for a work note.  Patient was seen in the ED on 09/30/2019 for an injury to his right wrist.  X-rays were negative for fracture but patient states that he did not go to work on that day as well as today.  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  He continues to wear the splint that was placed on the 23rd.  There is been no reinjury.  He rates his pain as an 8 out of 10.      History reviewed. No pertinent past medical history.  There are no active problems to display for this patient.   Past Surgical History:  Procedure Laterality Date  . SKIN GRAFT      Prior to Admission medications   Medication Sig Start Date End Date Taking? Authorizing Provider  diphenhydrAMINE (BENADRYL) 25 mg capsule Take 1 capsule (25 mg total) by mouth at bedtime as needed for itching. 04/27/16 04/27/17  Joanne Gavel, MD  ibuprofen (ADVIL) 600 MG tablet Take 1 tablet (600 mg total) by mouth every 8 (eight) hours as needed. 09/30/19   Sable Feil, PA-C  traMADol (ULTRAM) 50 MG tablet Take 1 tablet (50 mg total) by mouth every 6 (six) hours as needed for moderate pain. 09/30/19   Sable Feil, PA-C    Allergies Penicillins  No family history on file.  Social History Social History   Tobacco Use  . Smoking status: Current Every Day Smoker    Packs/day: 1.00    Types: Cigarettes  . Smokeless tobacco: Never Used  Substance Use Topics  . Alcohol use: No  . Drug use: No    Review of Systems  Constitutional: No fever/chills Cardiovascular: Denies chest pain. Respiratory: Denies  shortness of breath. Musculoskeletal: Positive for right wrist pain. Skin: Negative for rash. Neurological: Negative for headaches, focal weakness or numbness. ____________________________________________   PHYSICAL EXAM:  VITAL SIGNS: ED Triage Vitals  Enc Vitals Group     BP 10/04/19 1310 (!) 140/92     Pulse Rate 10/04/19 1310 86     Resp 10/04/19 1310 18     Temp 10/04/19 1310 98.5 F (36.9 C)     Temp Source 10/04/19 1310 Oral     SpO2 10/04/19 1310 97 %     Weight 10/04/19 1311 240 lb (108.9 kg)     Height 10/04/19 1311 5\' 11"  (1.803 m)     Head Circumference --      Peak Flow --      Pain Score 10/04/19 1311 8     Pain Loc --      Pain Edu? --      Excl. in [place]? --     Constitutional: Alert and oriented. Well appearing and in no acute distress. Eyes: Conjunctivae are normal.  Head: Atraumatic. Neck: No stridor.   Cardiovascular: Normal rate, regular rhythm. Grossly normal heart sounds.  Good peripheral circulation. Respiratory: Normal respiratory effort.  No retractions. Lungs CTAB. Musculoskeletal: Right wrist is still in a OCL splint.  Patient is able to move digits without any difficulty motor  sensory function intact. Neurologic:  Normal speech and language. No gross focal neurologic deficits are appreciated. No gait instability. Skin:  Skin is warm, dry and intact.  Psychiatric: Mood and affect are normal. Speech and behavior are normal.  ____________________________________________   LABS (all labs ordered are listed, but only abnormal results are displayed)  Labs Reviewed - No data to display  PROCEDURES  Procedure(s) performed (including Critical Care):  Procedures   ____________________________________________   INITIAL IMPRESSION / ASSESSMENT AND PLAN / ED COURSE  As part of my medical decision making, I reviewed the following data within the electronic MEDICAL RECORD NUMBER Notes from prior ED visits and [place] Controlled Substance Database  Jeffery Caldwell was evaluated in Emergency Department on 10/04/2019 for the symptoms described in the history of present illness. He was evaluated in the context of the global COVID-19 pandemic, which necessitated consideration that the patient might be at risk for infection with the SARS-CoV-2 virus that causes COVID-19. Institutional protocols and algorithms that pertain to the evaluation of patients at risk for COVID-19 are in a state of rapid change based on information released by regulatory bodies including the CDC and federal and state organizations. These policies and algorithms were followed during the patient's care in the ED.   33 year old male presents to the ED with continued complaint of right wrist pain.  He continues to wear his OCL splint but is unable to his job at [employer] and moving it.  He is requesting a work note to cover him for the day he was seen on the 23rd and also today as he did not go to work.  He is encouraged to call and make an appointment with Dr. Martha Clan for his continued wrist pain.  X-rays from his previous visit was reviewed and no bony injury was noted.  ____________________________________________   FINAL CLINICAL IMPRESSION(S) / ED DIAGNOSES  Final diagnoses:  Acute wrist pain, right     ED Discharge Orders    None       Note:  This document was prepared using Dragon voice recognition software and may include unintentional dictation errors.    Tommi Rumps, PA-C 10/04/19 1447    Willy Eddy, MD 10/04/19 1535

## 2019-10-04 NOTE — ED Triage Notes (Signed)
Pt presents to ED via POV for doctor's note for missing work today. 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

## 2020-02-21 ENCOUNTER — Other Ambulatory Visit: Payer: Self-pay | Admitting: Family Medicine

## 2020-02-21 DIAGNOSIS — S060X0A Concussion without loss of consciousness, initial encounter: Secondary | ICD-10-CM

## 2021-08-03 IMAGING — DX DG WRIST COMPLETE 3+V*R*
4 series · 4 of 4 positions shown · non-contrast
Comparison: Radiographs of the right wrist 12/08/2014

CLINICAL DATA: Pain and edema secondary to hyperextension incident
prior to arrival.

EXAM:
RIGHT WRIST - COMPLETE 3+ VIEW

[wrist ap (1 of 2)]
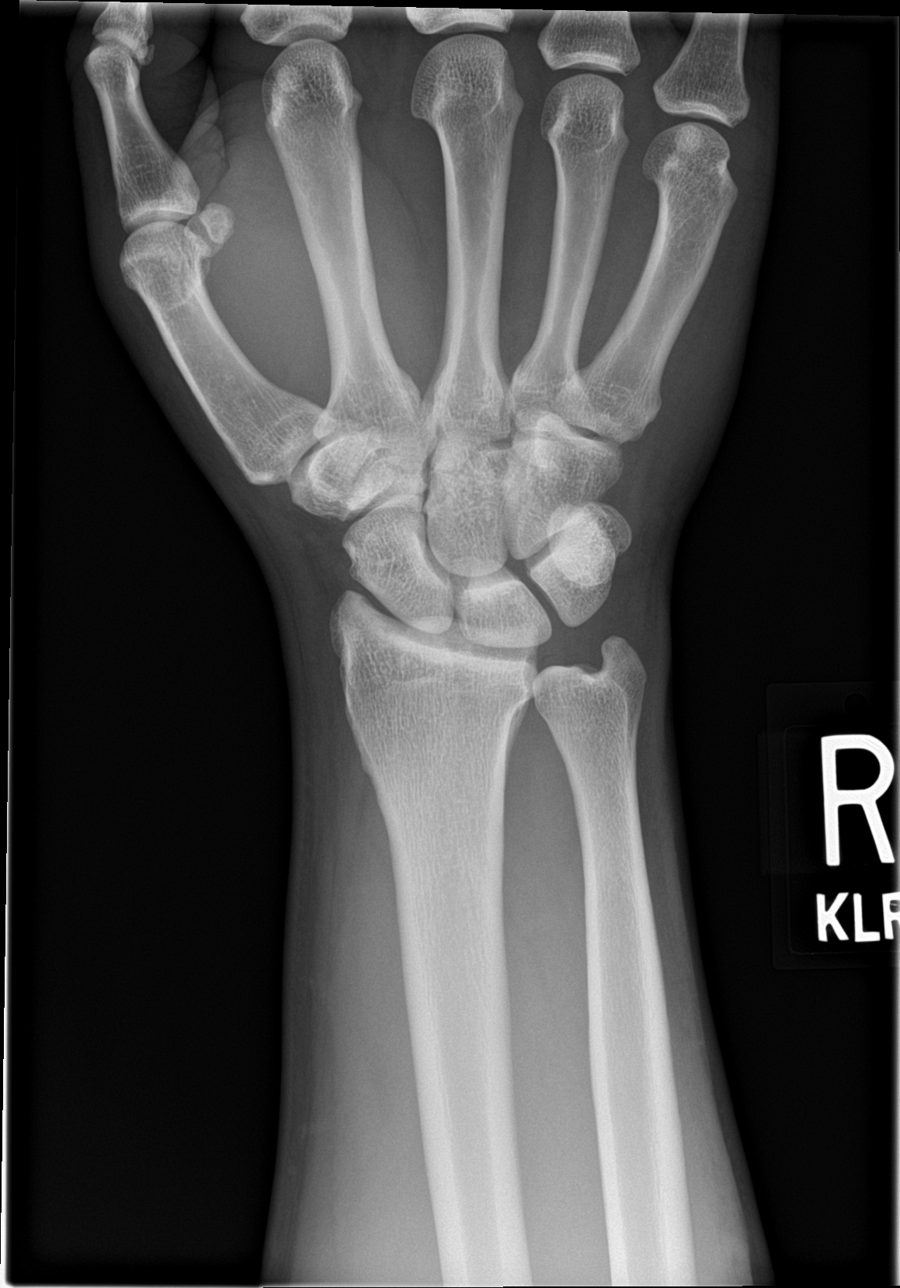

[wrist obl]
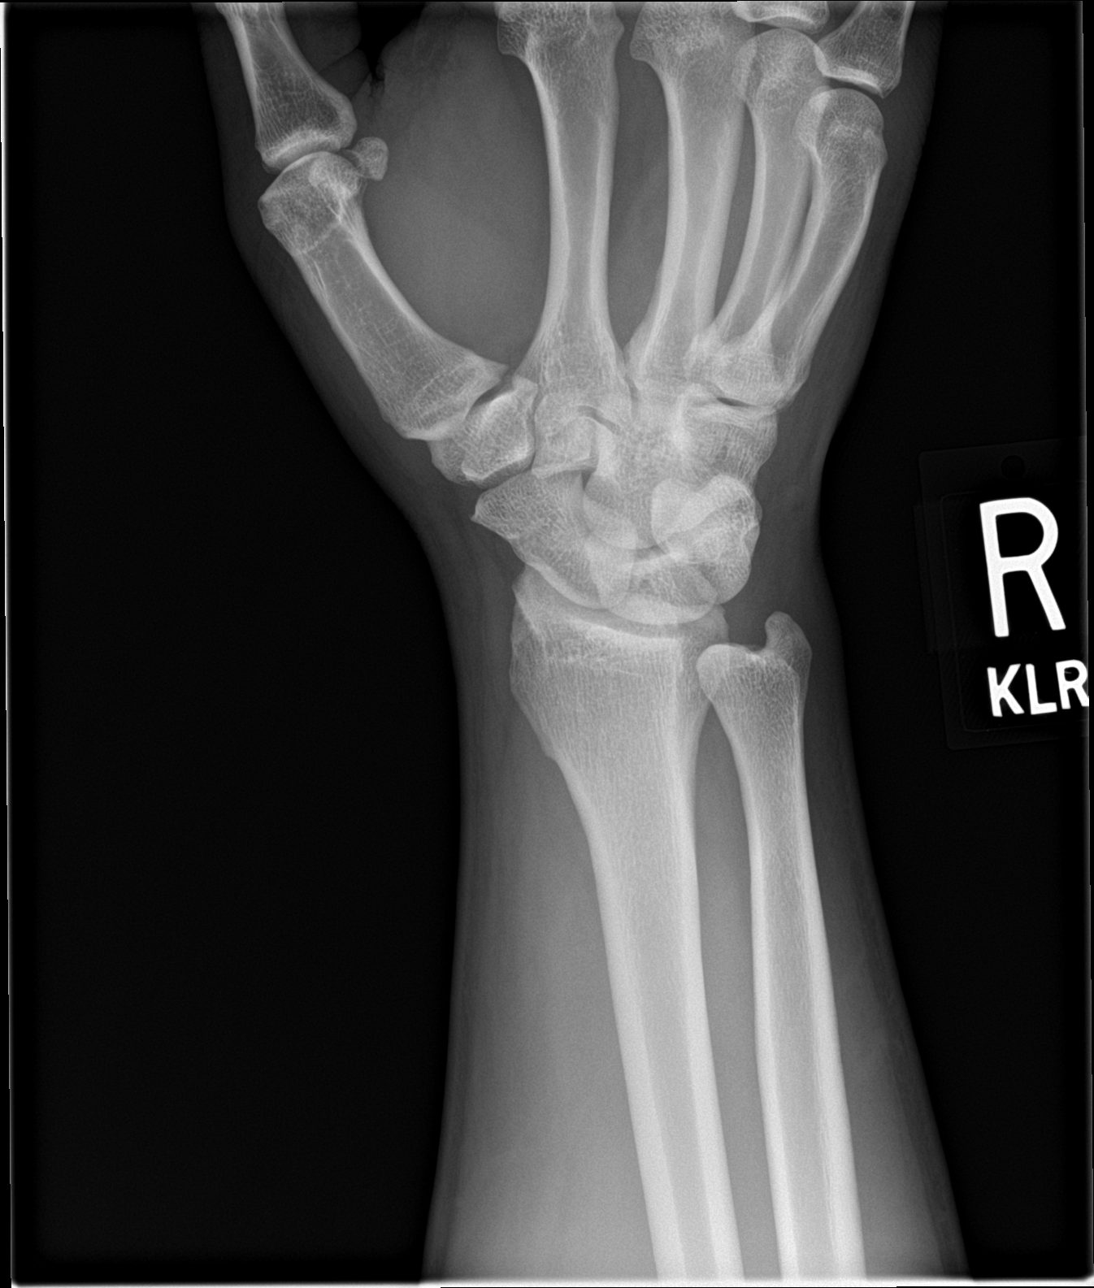

[wrist lat]
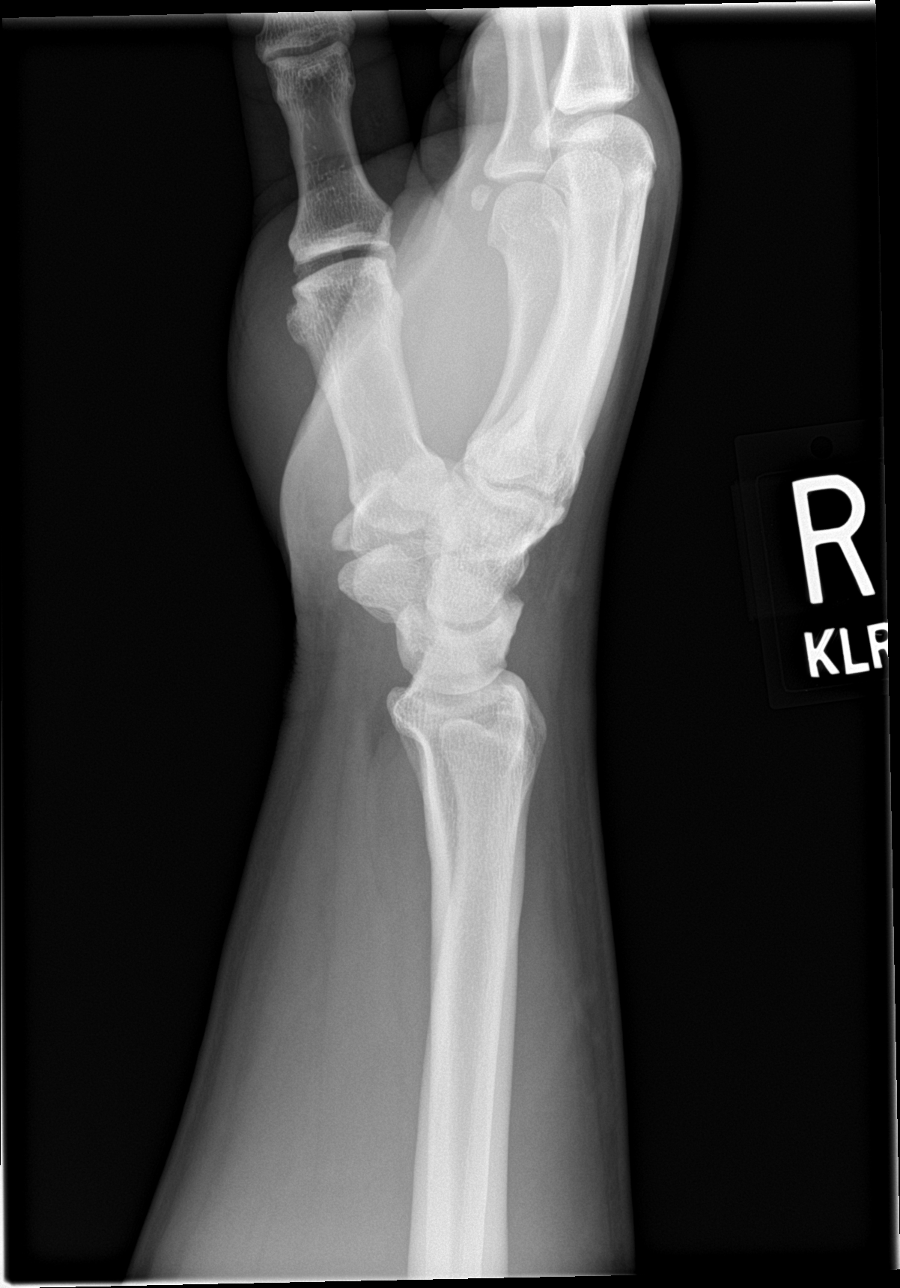

[wrist ap (2 of 2)]
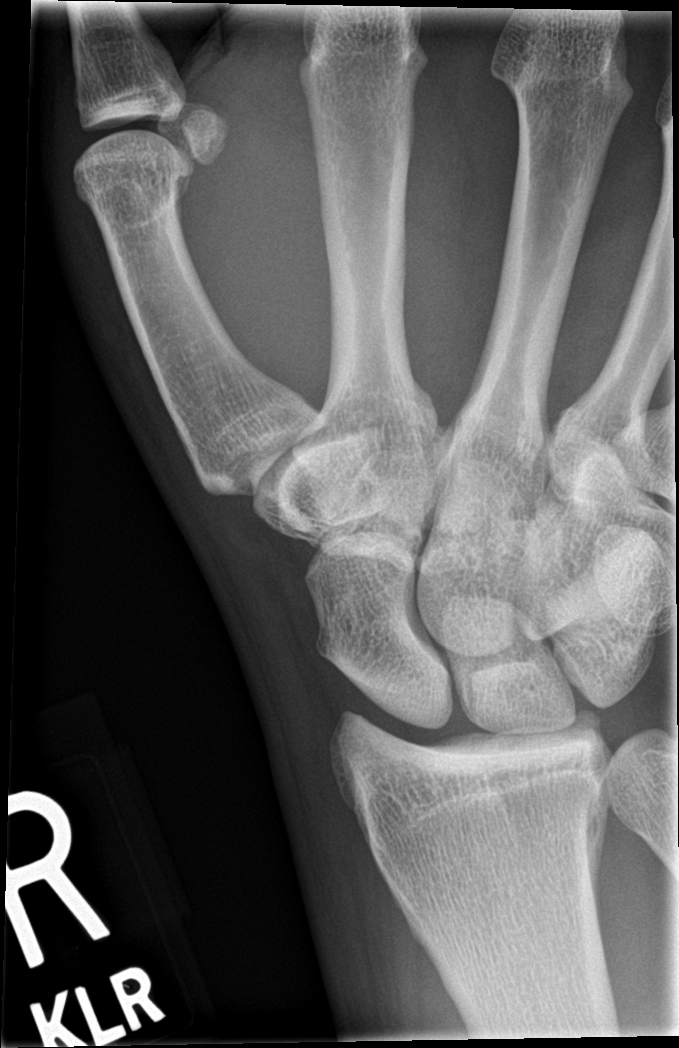

[4 of 4 positions shown; findings below may reference images not displayed]

FINDINGS: There is normal bony alignment.

No evidence of acute osseous or articular abnormality.

The joint spaces are maintained.
IMPRESSION: No evidence of acute osseous or articular abnormality.
